# Patient Record
Sex: Female | Born: 1960 | Race: White | Hispanic: No | Marital: Married | Smoking: Never smoker
Health system: Southern US, Community
[De-identification: ages and names within clinical notes are randomized; demographics above are authoritative.]

---

## 2014-11-08 ENCOUNTER — Emergency Department (INDEPENDENT_AMBULATORY_CARE_PROVIDER_SITE_OTHER): Payer: Self-pay

## 2014-11-08 ENCOUNTER — Emergency Department
Admission: EM | Admit: 2014-11-08 | Discharge: 2014-11-08 | Disposition: A | Discharge status: Home / Self Care | Payer: Managed Care, Other (non HMO) | Source: Home / Self Care | Attending: Family Medicine | Admitting: Family Medicine

## 2014-11-08 ENCOUNTER — Encounter: Payer: Self-pay | Admitting: *Deleted

## 2014-11-08 DIAGNOSIS — S80211A Abrasion, right knee, initial encounter: Secondary | ICD-10-CM | POA: Diagnosis not present

## 2014-11-08 DIAGNOSIS — M25572 Pain in left ankle and joints of left foot: Secondary | ICD-10-CM | POA: Diagnosis not present

## 2014-11-08 DIAGNOSIS — W1839XA Other fall on same level, initial encounter: Secondary | ICD-10-CM

## 2014-11-08 DIAGNOSIS — S93402A Sprain of unspecified ligament of left ankle, initial encounter: Secondary | ICD-10-CM | POA: Diagnosis not present

## 2014-11-08 DIAGNOSIS — M7732 Calcaneal spur, left foot: Secondary | ICD-10-CM

## 2014-11-08 DIAGNOSIS — W010XXA Fall on same level from slipping, tripping and stumbling without subsequent striking against object, initial encounter: Secondary | ICD-10-CM

## 2014-11-08 NOTE — ED Provider Notes (Signed)
CSN: [id number]     Arrival date & time 11/08/14  1601 History   First MD Initiated Contact with Patient 11/08/14 [phone]     Chief Complaint  Patient presents with  . Ankle Pain   (Consider location/radiation/quality/duration/timing/severity/associated sxs/prior Treatment) HPI  Pt is a 54yo female presenting to [facility] with c/o sudden onset Left foot and ankle pain with associated swelling 2 hours s/p trip and fall. Pt reports falling and scraping her Right knee and twisting her Left ankle. No other injuries. Pain is moderate to severe, worse with palpation and weight bearing. Pt took  Ibuprofen PTA and used an ace wrap to help her ambulate into clinic.  Denies prior injury or surgeries to Left foot or ankle.  History reviewed. No pertinent past medical history. History reviewed. No pertinent past surgical history. Family History  Problem Relation Age of Onset  . Heart disease Mother   . Stroke Mother   . Heart disease Father    History  Substance Use Topics  . Smoking status: Never Smoker   . Smokeless tobacco: Never Used  . Alcohol Use: Yes   OB History    No data available     Review of Systems  Musculoskeletal: Positive for myalgias, joint swelling, arthralgias and gait problem. Negative for back pain, neck pain and neck stiffness.  Skin: Negative for color change and wound.  Neurological: Positive for numbness ( tingling in Left foot). Negative for weakness.    Allergies  Review of patient's allergies indicates no known allergies.  Home Medications   Prior to Admission medications   Not on File   BP 149/77 mmHg  Pulse 85  Resp 16  Ht 5' 6.5" (1.689 m)  Wt 155 lb (70.308 kg)  BMI 24.65 kg/m2  SpO2 97% Physical Exam  Constitutional: She is oriented to person, place, and time. She appears well-developed and well-nourished.  HENT:  Head: Normocephalic and atraumatic.  Eyes: EOM are normal.  Neck: Normal range of motion.  Cardiovascular: Normal rate.    Pulses:      Dorsalis pedis pulses are 2+ on the left side.       Posterior tibial pulses are 2+ on the left side.  Left foot: toes- Cap refill <3 seconds  Pulmonary/Chest: Effort normal.  Musculoskeletal: Normal range of motion. She exhibits edema and tenderness.  Left ankle: no edema, FROM, mild tenderness to lateral aspect. Left foot: mild edema with 0.5cm nodule on dorsal lateral aspect, associated tenderness. FROM foot and toes. Increased pain with dorsiflexion and plantarflexion against resistance.  FROM Left knee, no tenderness, no calf tenderness. Compartments are soft.  Neurological: She is alert and oriented to person, place, and time.  Left foot: sensation in tact, symmetric in all toes.  Skin: Skin is warm and dry.  Right knee: superficial abrasion, no active bleeding. Non-tender. Left foot and ankle: Skin in tact. No ecchymosis, erythema, or warmth.    Psychiatric: She has a normal mood and affect. Her behavior is normal.  Nursing note and vitals reviewed.   ED Course  Procedures (including critical care time) Labs Review Labs Reviewed - No data to display  Imaging Review Dg Ankle Complete Left  11/08/2014   CLINICAL DATA:  Fall, twisted left ankle, lateral pain  EXAM: LEFT ANKLE COMPLETE - 3+ VIEW  COMPARISON:  None.  FINDINGS: Three views of left ankle submitted. No acute fracture or subluxation. Ankle mortise is preserved. Tiny plantar spur of calcaneus.  IMPRESSION: Negative.   Electronically Signed  By: Natasha Mead M.D.   On: 11/08/2014 16:44   Dg Foot Complete Left  11/08/2014   CLINICAL DATA:  Fall, twisted left foot, lateral pain  EXAM: LEFT FOOT - COMPLETE 3+ VIEW  COMPARISON:  None.  FINDINGS: Three views of the left foot submitted. No acute fracture or subluxation. Tiny plantar spur of calcaneus.  IMPRESSION: No acute fracture or subluxation.  Tiny plantar spur of calcaneus   Electronically Signed   By: Natasha Mead M.D.   On: 11/08/2014 16:44     MDM   1.  Fall from slip, trip, or stumble, initial encounter   2. Left ankle pain   3. Left ankle sprain, initial encounter   4. Knee abrasion, right, initial encounter     Pt presenting to [facility] with Left foot and ankle pain with swelling after trip and fall. Associated superficial abrasion Right knee. No other injuries. Left foot is neurovascularly in tact. Skin in tact.  Plain films: no acute fracture or subluxation. Tiny plantar spur of calcaneus.  Discussed imaging with pt. Will tx as sprain. ASO and crutches provided in UC. Discharged home with exercises for ankle sprain. Discussed R.I.C.E tx. F/u with PCP in 1-2 weeks if not improving. Pt is from out of town. Agrees with tx plan.    Junius Finner, PA-C 11/08/14 1657

## 2014-11-08 NOTE — Discharge Instructions (Signed)
You may take 400-600mg  ibuprofen every 6-8 hours as needed for pain and swelling.  See below for further instructions.

## 2014-11-08 NOTE — ED Notes (Signed)
Pt reports rolling left ankle today. No previous injury. Swelling and pain present.

## 2015-12-05 IMAGING — CR DG FOOT COMPLETE 3+V*L*
3 series · 3 of 3 positions shown · non-contrast
Comparison: None.

CLINICAL DATA: Fall, twisted left foot, lateral pain

EXAM:
LEFT FOOT - COMPLETE 3+ VIEW

[foot ap]
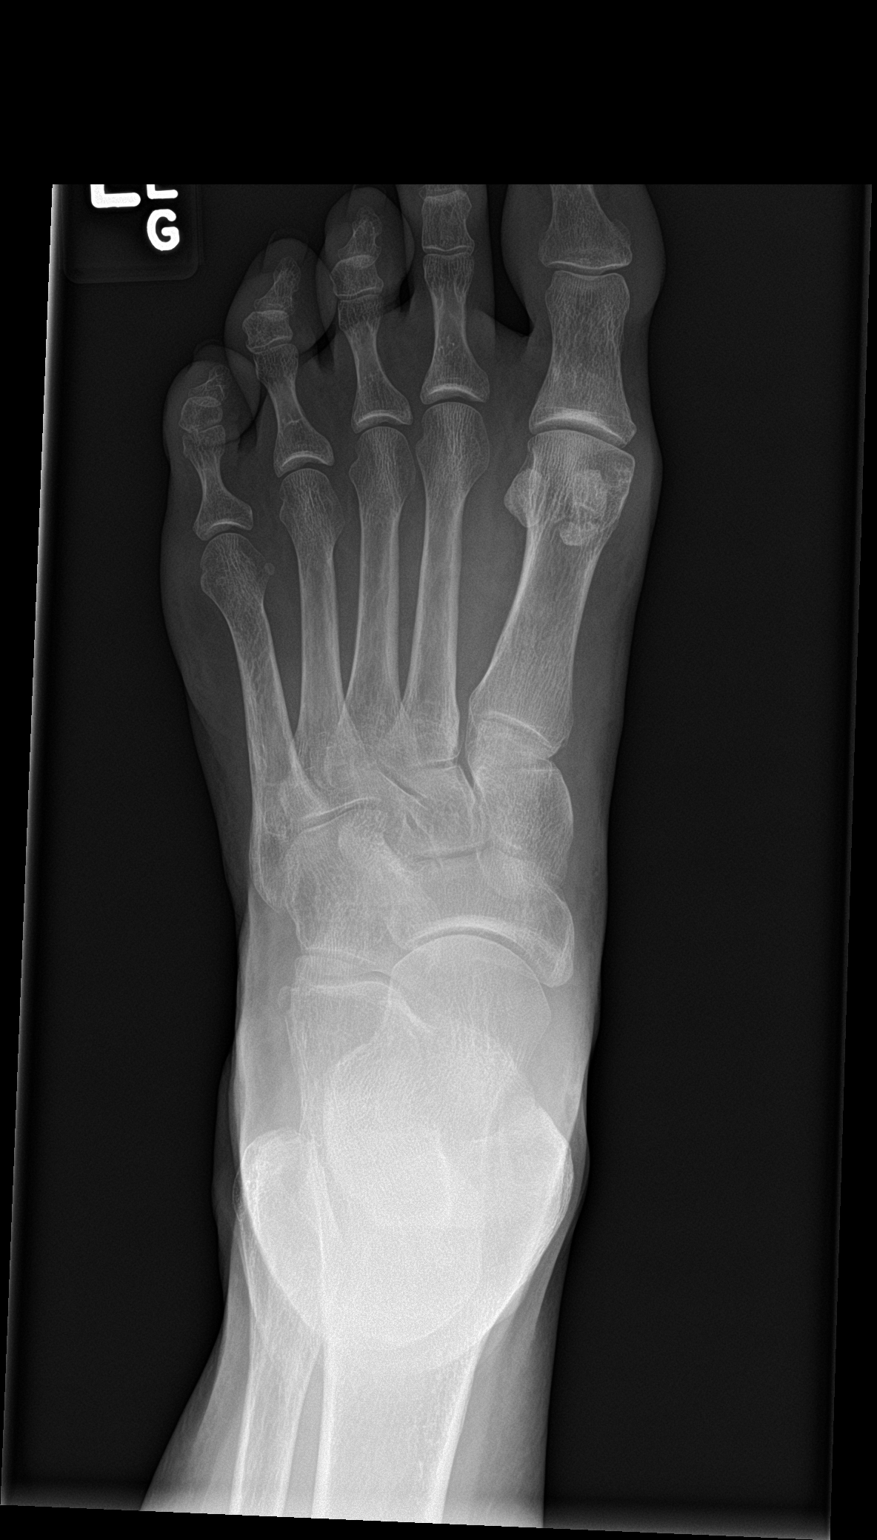

[foot obl]
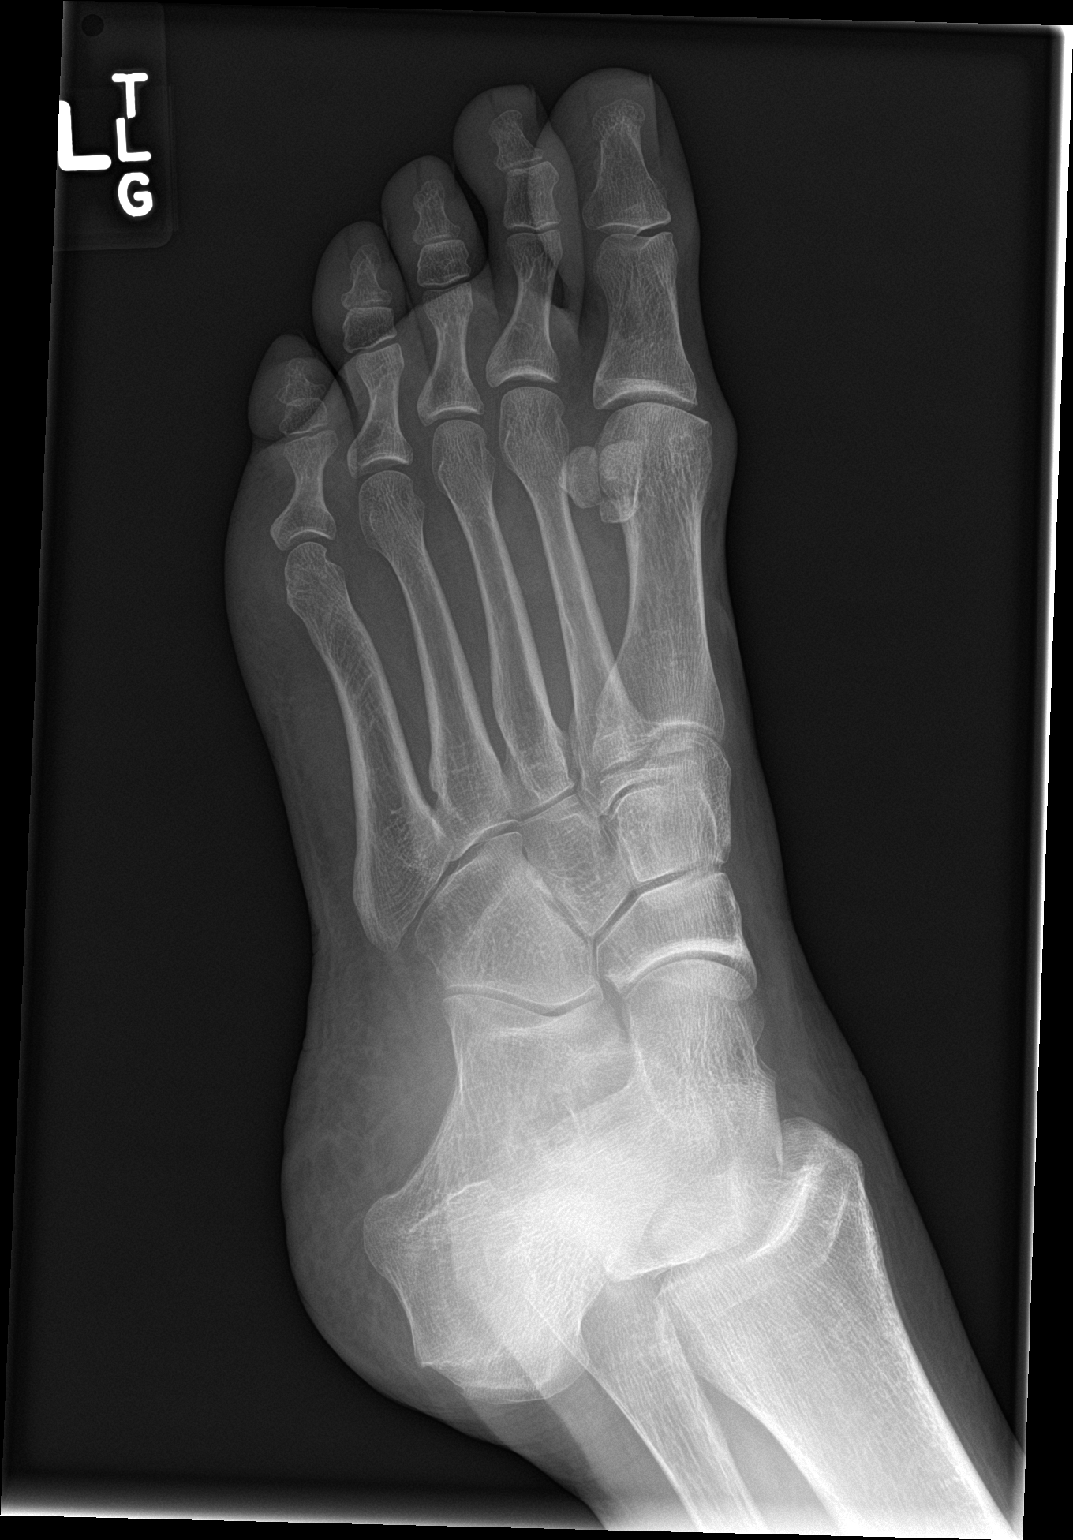

[foot lat]
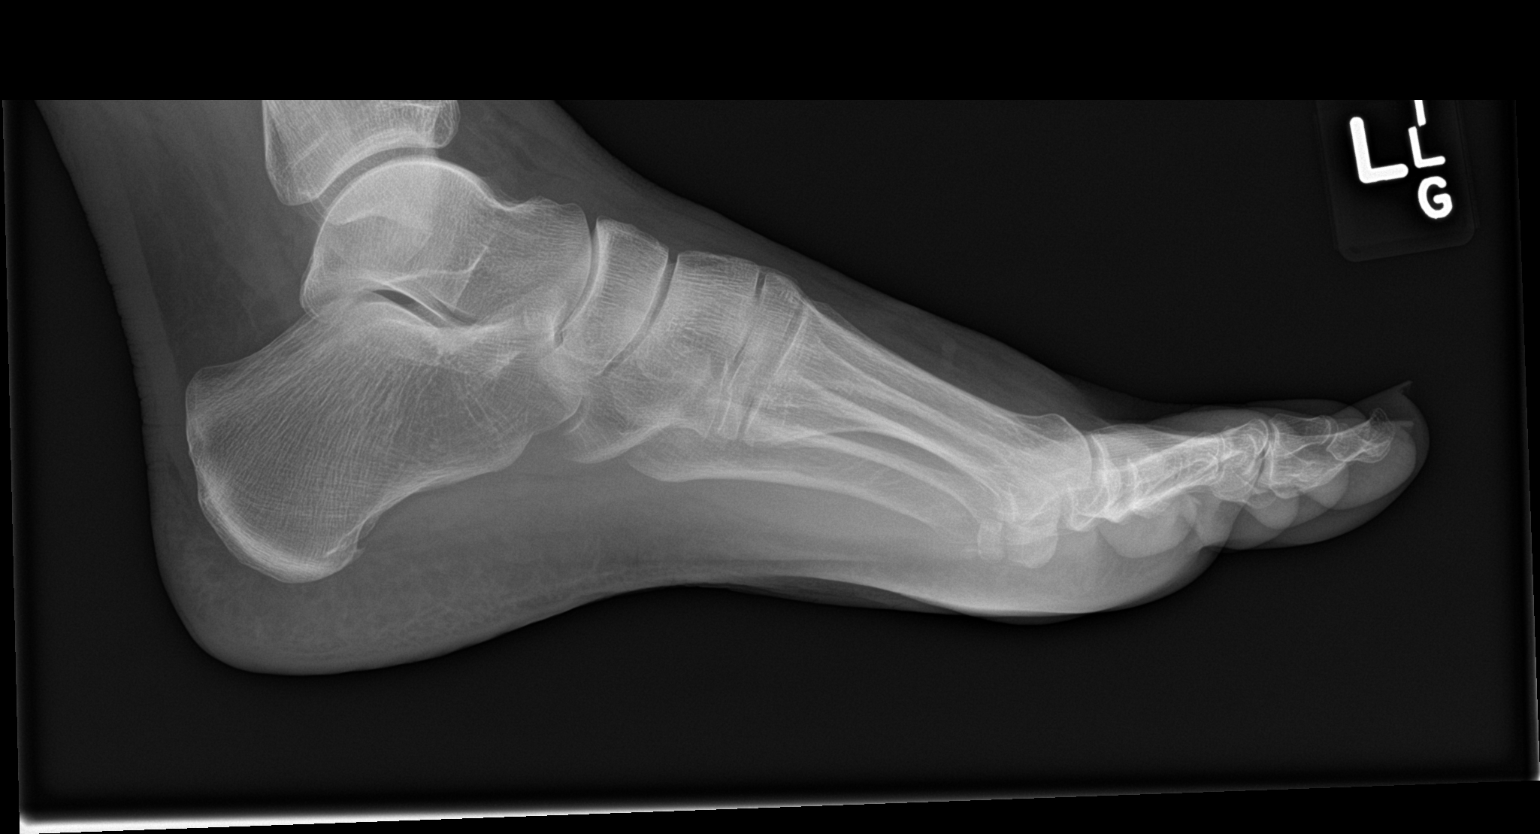

[3 of 3 positions shown; findings below may reference images not displayed]

FINDINGS: Three views of the left foot submitted. No acute fracture or
subluxation. Tiny plantar spur of calcaneus.
IMPRESSION: No acute fracture or subluxation.  Tiny plantar spur of calcaneus
